# Patient Record
Sex: Male | Born: 1955 | Race: White | Hispanic: No | Marital: Married | Smoking: Former smoker
Health system: Southern US, Academic
[De-identification: ages and names within clinical notes are randomized; demographics above are authoritative.]

## PROBLEM LIST (undated history)

## (undated) DIAGNOSIS — I10 Essential (primary) hypertension: Secondary | ICD-10-CM

## (undated) DIAGNOSIS — Z973 Presence of spectacles and contact lenses: Secondary | ICD-10-CM

## (undated) HISTORY — PX: HX STENTING (ANY): 2100001347

---

## 1988-06-30 ENCOUNTER — Ambulatory Visit (HOSPITAL_COMMUNITY): Payer: Self-pay

## 2019-05-21 ENCOUNTER — Inpatient Hospital Stay (HOSPITAL_COMMUNITY)
Admission: EM | Admit: 2019-05-21 | Discharge: 2019-05-21 | Disposition: A | Discharge status: Home / Self Care | Payer: 59 | Source: Other Acute Inpatient Hospital

## 2019-05-21 DIAGNOSIS — M79662 Pain in left lower leg: Secondary | ICD-10-CM

## 2019-07-21 ENCOUNTER — Inpatient Hospital Stay (HOSPITAL_COMMUNITY)
Admission: EM | Admit: 2019-07-21 | Discharge: 2019-07-21 | Disposition: A | Discharge status: Home / Self Care | Payer: 59 | Source: Other Acute Inpatient Hospital

## 2019-07-21 DIAGNOSIS — Z20828 Contact with and (suspected) exposure to other viral communicable diseases: Secondary | ICD-10-CM

## 2019-07-21 DIAGNOSIS — R410 Disorientation, unspecified: Secondary | ICD-10-CM

## 2019-07-21 DIAGNOSIS — R220 Localized swelling, mass and lump, head: Secondary | ICD-10-CM

## 2019-07-21 DIAGNOSIS — R509 Fever, unspecified: Secondary | ICD-10-CM

## 2019-08-14 ENCOUNTER — Inpatient Hospital Stay (HOSPITAL_COMMUNITY)
Admission: EM | Admit: 2019-08-14 | Discharge: 2019-08-14 | Disposition: A | Discharge status: Home / Self Care | Payer: 59 | Source: Other Acute Inpatient Hospital

## 2019-08-14 DIAGNOSIS — R509 Fever, unspecified: Secondary | ICD-10-CM

## 2019-08-14 DIAGNOSIS — R519 Headache, unspecified: Secondary | ICD-10-CM

## 2019-08-28 ENCOUNTER — Inpatient Hospital Stay (HOSPITAL_COMMUNITY)
Admission: EM | Admit: 2019-08-28 | Discharge: 2019-08-28 | Disposition: A | Discharge status: Home / Self Care | Payer: 59 | Source: Other Acute Inpatient Hospital

## 2019-08-28 DIAGNOSIS — G060 Intracranial abscess and granuloma: Secondary | ICD-10-CM

## 2019-08-28 DIAGNOSIS — J189 Pneumonia, unspecified organism: Secondary | ICD-10-CM

## 2019-08-28 DIAGNOSIS — Z87891 Personal history of nicotine dependence: Secondary | ICD-10-CM

## 2019-08-28 DIAGNOSIS — R0902 Hypoxemia: Secondary | ICD-10-CM

## 2019-08-28 DIAGNOSIS — Z20828 Contact with and (suspected) exposure to other viral communicable diseases: Secondary | ICD-10-CM

## 2019-08-29 DIAGNOSIS — J9601 Acute respiratory failure with hypoxia: Secondary | ICD-10-CM

## 2019-08-29 DIAGNOSIS — J449 Chronic obstructive pulmonary disease, unspecified: Secondary | ICD-10-CM

## 2019-08-29 DIAGNOSIS — I10 Essential (primary) hypertension: Secondary | ICD-10-CM

## 2019-08-30 DIAGNOSIS — R0602 Shortness of breath: Secondary | ICD-10-CM

## 2020-02-06 IMAGING — MR MRI BRAIN WITHOUT AND WITH CONTRAST
11 of 12 series · 40 of 48 positions shown · IV contrast (prohance)
Comparison: none

Pertinent Hx:  Followup intracranial abscess and granuloma.
TECHNIQUE: T1, T2, and FLAIR weighted axial images of the brain were performed along with T2 weighted coronal images. Additional diffusion weighted images were performed.  15 mL Prohance is administered and T1 weighted axials, coronals, and sagittals are performed.

[Series 3: T1 · sagittal · 5.0mm · 0.47mm/px · 2 of 24 slices shown (1 of 6)]
[im 1/24]
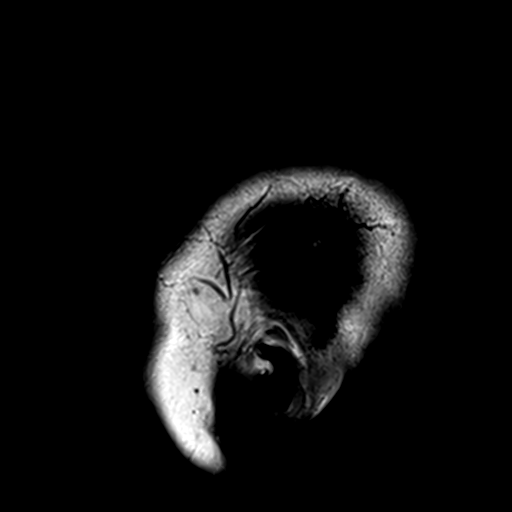
[im 24/24]
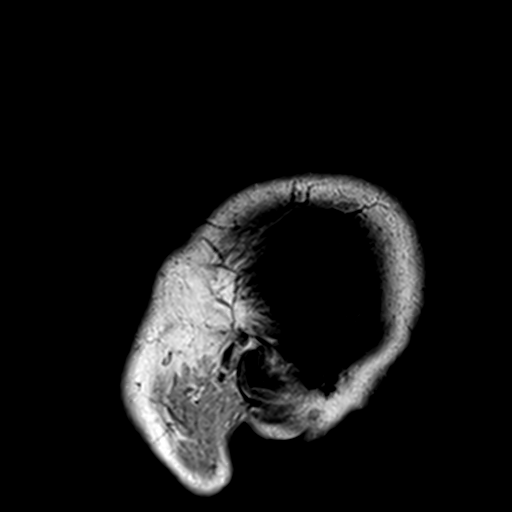

[Series 4: T2 · coronal · 5.0mm · 0.57mm/px · 3 of 28 slices shown (1 of 2)]
[im 1/28]
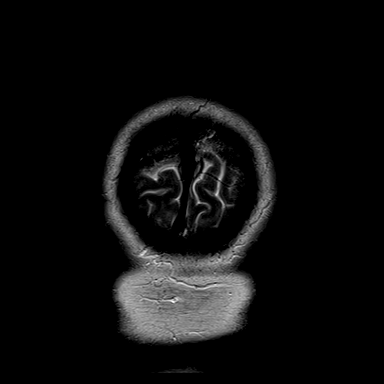
[im 14/28]
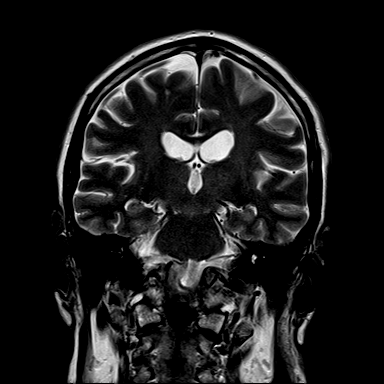
[im 28/28]
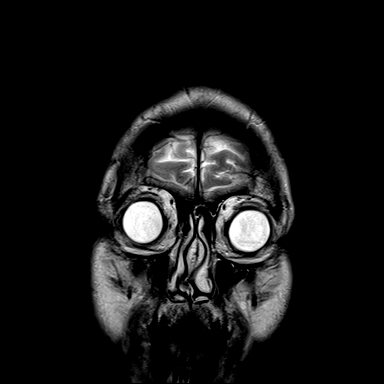

[Series 5: T2 · axial · 5.0mm · 0.57mm/px · z∈[-17,+116]mm · 3 of 26 slices shown (2 of 2)]
[im 1/26]
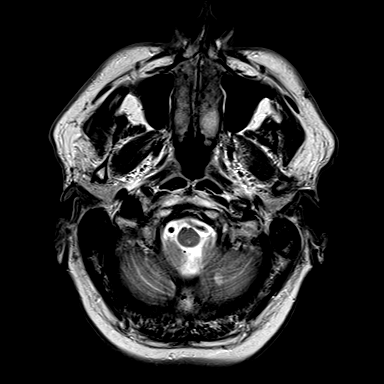
[im 13/26]
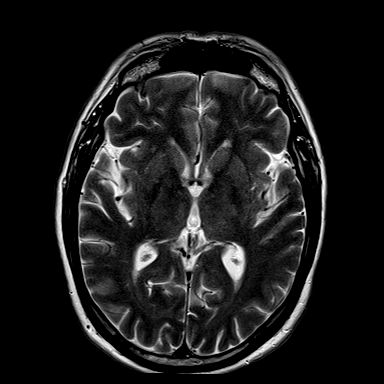
[im 26/26]
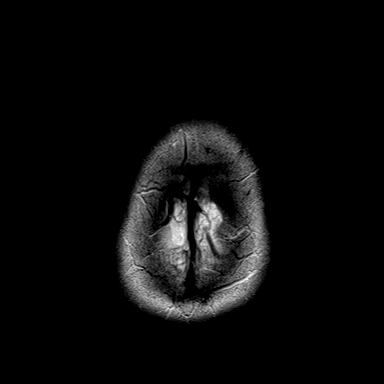

[Series 6: T1 · axial · 5.0mm · 0.69mm/px · z∈[-17,+116]mm · 3 of 26 slices shown (2 of 6)]
[im 1/26]
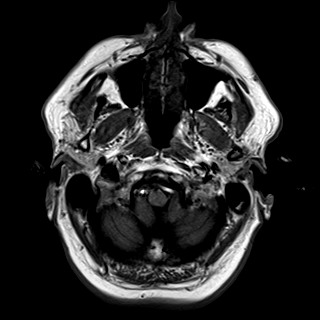
[im 13/26]
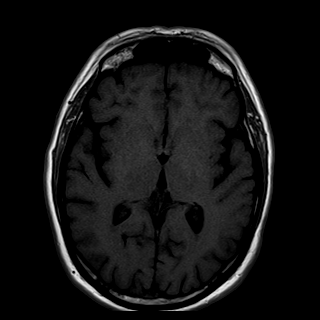
[im 26/26]
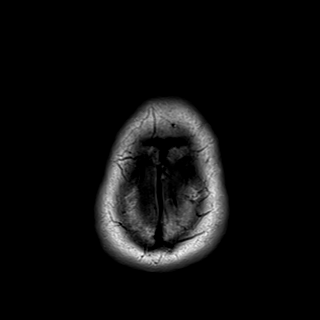

[Series 7: FLAIR · axial · 5.0mm · 0.43mm/px · z∈[-17,+116]mm · 3 of 26 slices shown]
[im 1/26]
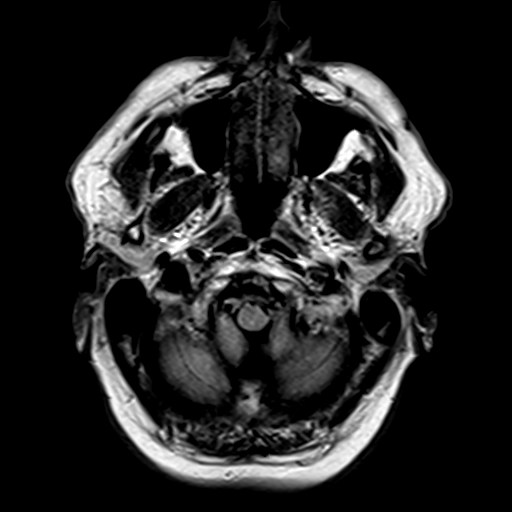
[im 13/26]
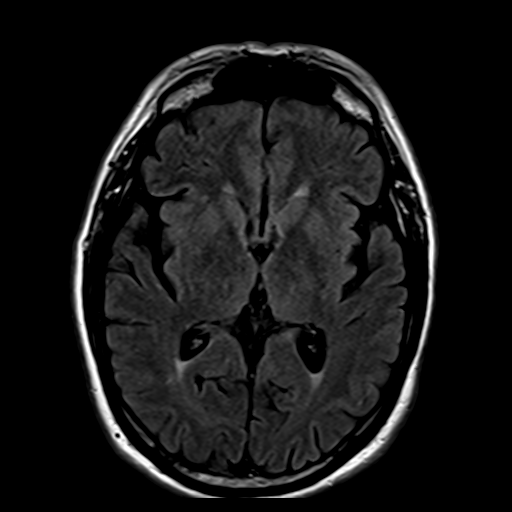
[im 26/26]
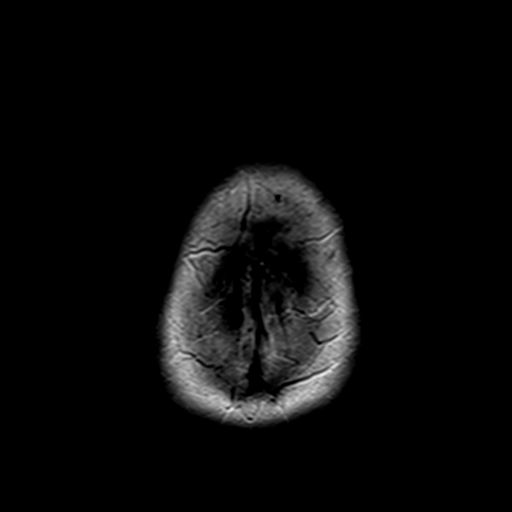

[Series 8: GRE · axial · 5.0mm · 0.69mm/px · z∈[-17,+116]mm · 3 of 26 slices shown]
[im 1/26]
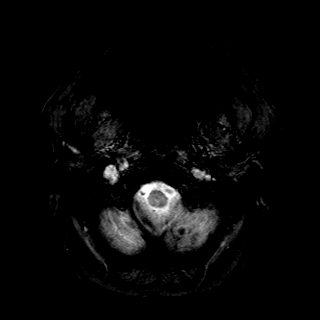
[im 13/26]
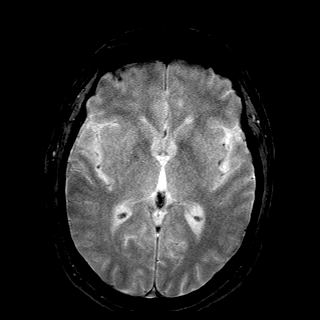
[im 26/26]
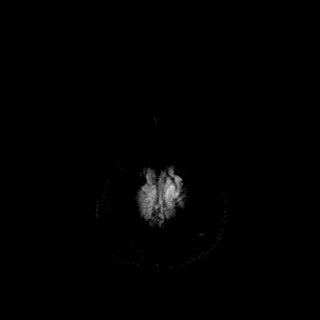

[Series 9: dfsn/t/se/epi/fsat · axial · 5.0mm · 1.72mm/px · z∈[-17,+79]mm · 6 of 78 slices shown]
[im 1/78]
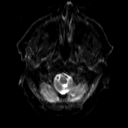
[im 12/78]
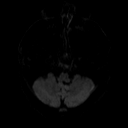
[im 23/78]
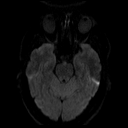
[im 34/78]
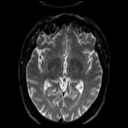
[im 45/78]
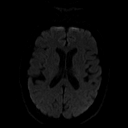
[im 56/78]
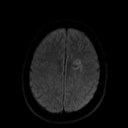

[Series 11: T1 · axial · 1.5mm · 0.69mm/px · z∈[-28,+122]mm · 8 of 104 slices shown (3 of 6)]
[im 1/104]
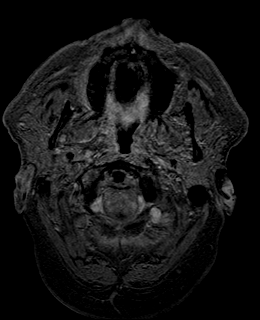
[im 21/104]
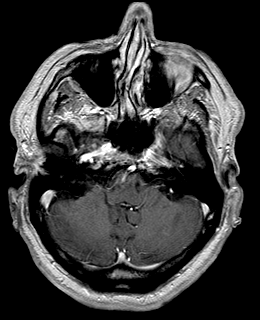
[im 31/104]
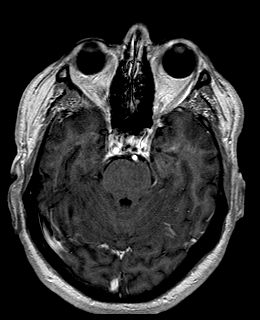
[im 42/104]
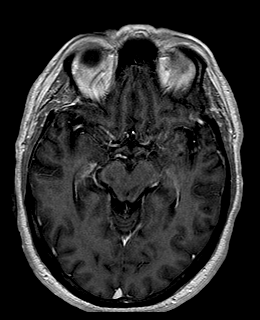
[im 62/104]
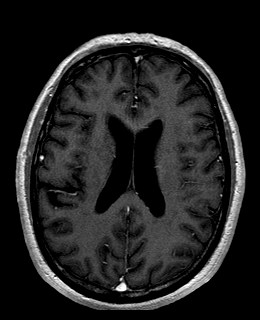
[im 73/104]
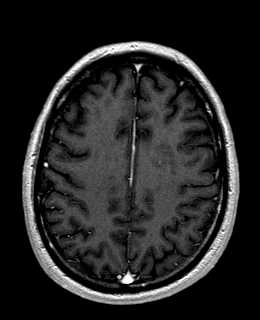
[im 83/104]
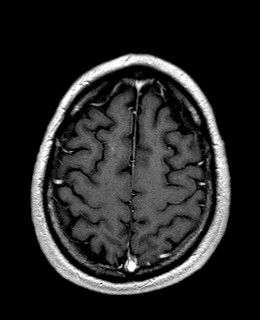
[im 104/104]
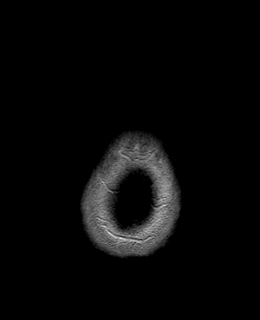

[Series 12: T1 · sagittal · 5.0mm · 0.47mm/px · 3 of 24 slices shown (4 of 6)]
[im 1/24]
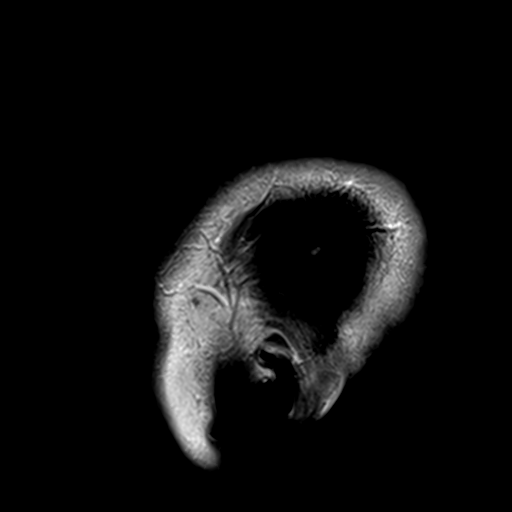
[im 12/24]
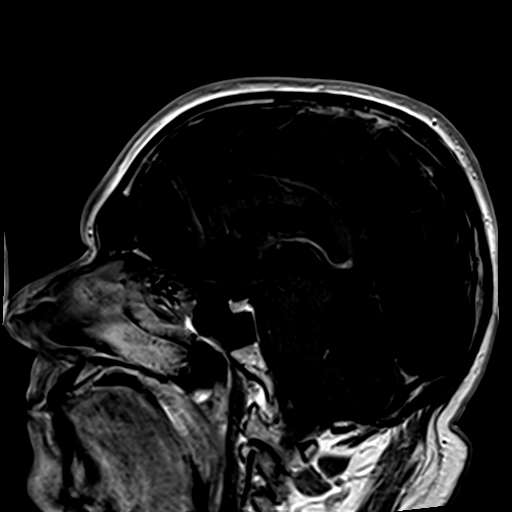
[im 24/24]
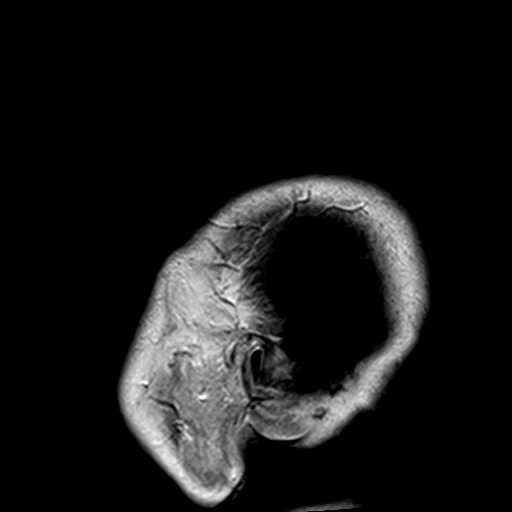

[Series 13: T1 · coronal · 5.0mm · 0.69mm/px · 3 of 28 slices shown (5 of 6)]
[im 1/28]
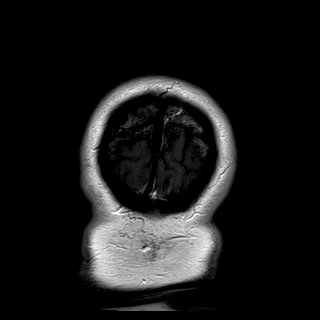
[im 14/28]
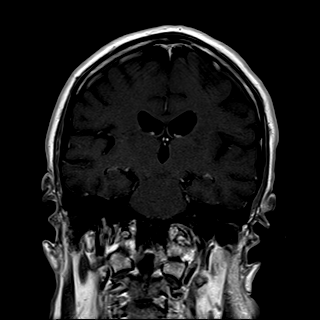
[im 28/28]
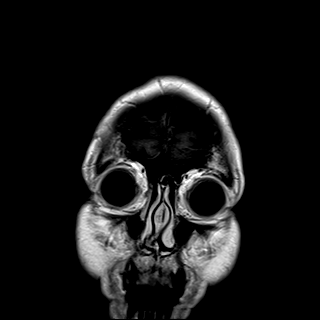

[Series 14: T1 · axial · 5.0mm · 0.86mm/px · z∈[-17,+116]mm · 3 of 26 slices shown (6 of 6)]
[im 1/26]
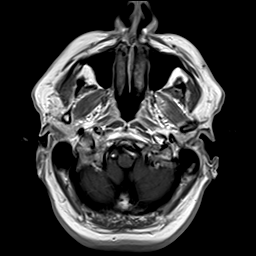
[im 13/26]
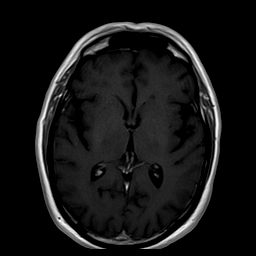
[im 26/26]
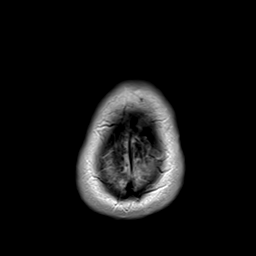

[40 of 48 positions shown; findings below may reference images not displayed]

FINDINGS: Nonspecific periventricular white matter lesions in the frontal and parietal lobes are unchanged in size.  Pinpoint enhancement is identified in the right frontal periventricular lesion and in the deep left parietal periventricular lesion which is unchanged when compared to the prior lesion November 2019.  Diffusion-weighted imaging remains positive in the right frontal periventricular lesion and the deep left parietal periventricular lesion.  No new lesions are seen.  In the noncontrasted lesions, their size remains unchanged when compared to the November 2019 scans.
IMPRESSION: When compared to the November 2019 scan, the size of the periventricular lesions and the degree of enhancement although minimal remain the same.

## 2020-08-20 IMAGING — MR MRI BRAIN WITHOUT AND WITH CONTRAST
11 series · 48 of 48 positions shown · IV contrast (prohance)
Comparison: Prior scans dated 02/06/2020 and 10/03/2019 are compared.

﻿

Pertinent Hx:  Followup intracranial mass, brain abscess.
TECHNIQUE: T1, T2, and FLAIR weighted axial images of the brain were performed along with T2 weighted coronal images. Additional diffusion weighted images were performed.  20 mL Prohance is administered and T1 weighted axials, coronals, and sagittals are performed.

[Series 2: T1 · sagittal · 5.0mm · 0.47mm/px · 2 of 24 slices shown (1 of 5)]
[im 1/24]
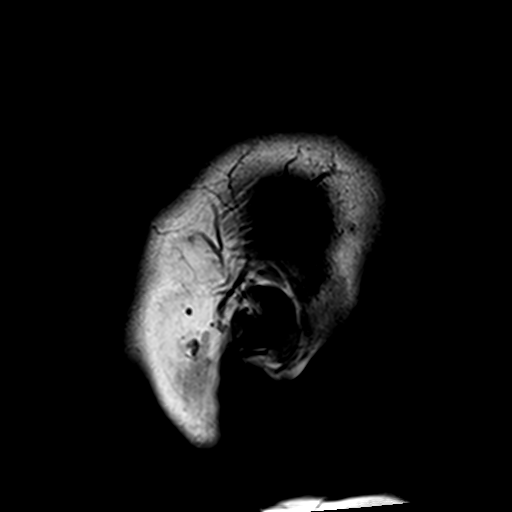
[im 24/24]
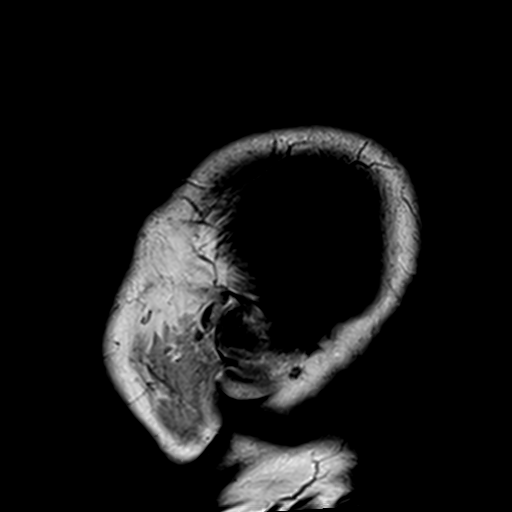

[Series 3: T2 · coronal · 5.0mm · 0.69mm/px · 4 of 28 slices shown (1 of 2)]
[im 1/28]
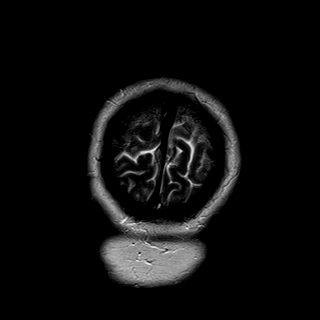
[im 10/28]
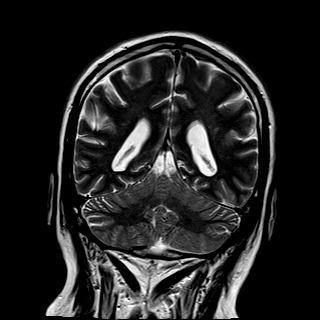
[im 19/28]
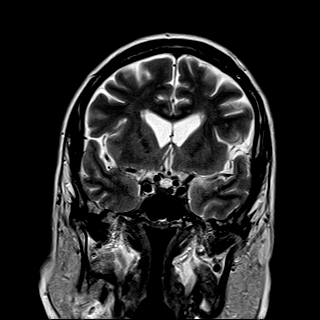
[im 28/28]
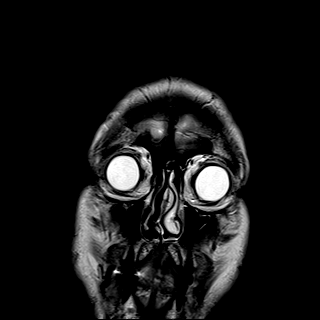

[Series 4: T2 · axial · 5.0mm · 0.69mm/px · z∈[-13,+133]mm · 4 of 28 slices shown (2 of 2)]
[im 1/28]
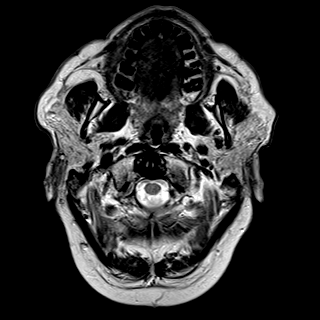
[im 10/28]
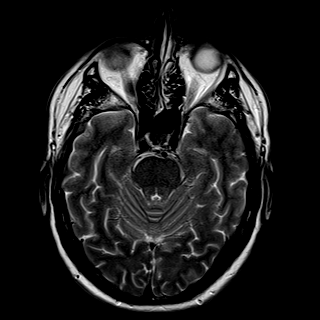
[im 19/28]
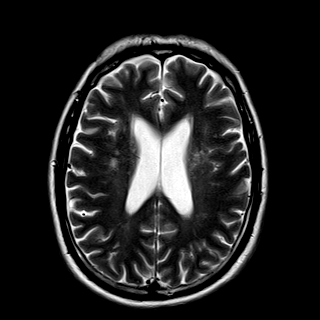
[im 28/28]
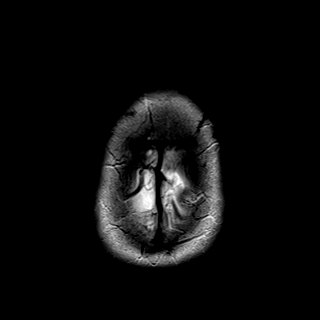

[Series 5: T1 · axial · 5.0mm · 0.86mm/px · z∈[-13,+133]mm · 4 of 28 slices shown (2 of 5)]
[im 1/28]
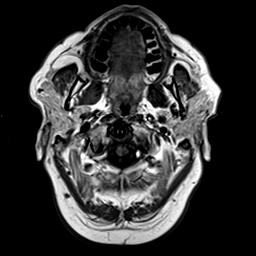
[im 10/28]
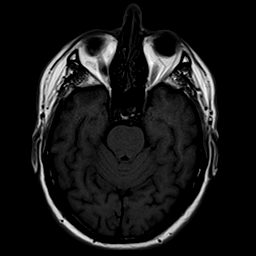
[im 19/28]
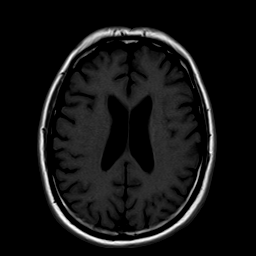
[im 28/28]
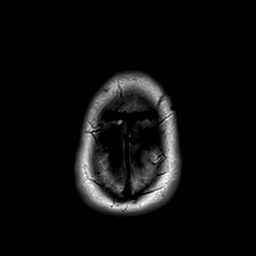

[Series 6: FLAIR · axial · 5.0mm · 0.69mm/px · z∈[-13,+133]mm · 4 of 28 slices shown]
[im 1/28]
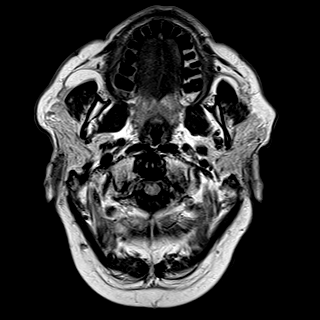
[im 10/28]
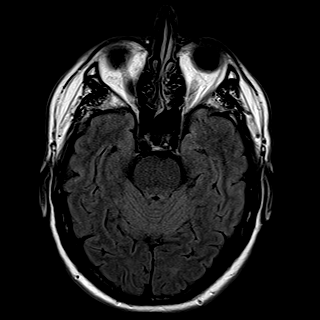
[im 19/28]
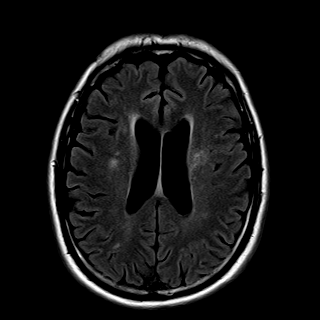
[im 28/28]
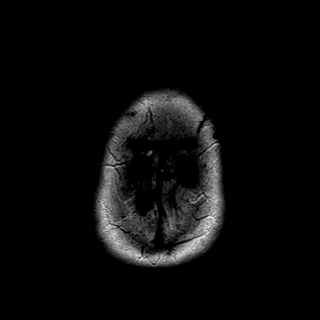

[Series 7: GRE · axial · 5.0mm · 0.86mm/px · z∈[-13,+133]mm · 4 of 28 slices shown]
[im 1/28]
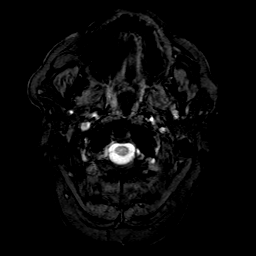
[im 10/28]
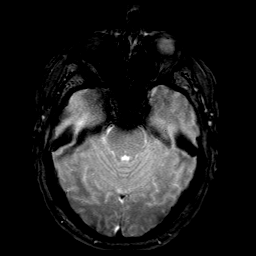
[im 19/28]
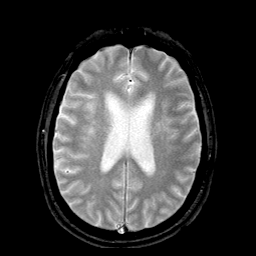
[im 28/28]
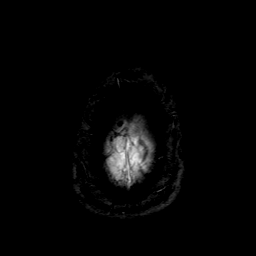

[Series 8: dfsn/t/se/epi/fsat · axial · 5.0mm · 1.88mm/px · z∈[-11,+135]mm · 11 of 84 slices shown]
[im 1/84]
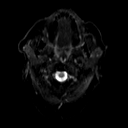
[im 9/84]
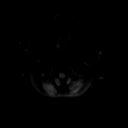
[im 17/84]
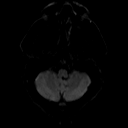
[im 25/84]
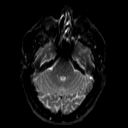
[im 34/84]
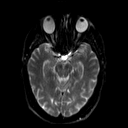
[im 42/84]
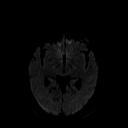
[im 50/84]
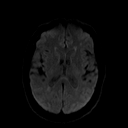
[im 59/84]
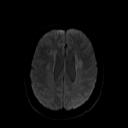
[im 67/84]
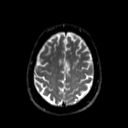
[im 75/84]
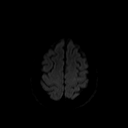
[im 84/84]
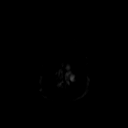

[Series 9: dfsn/t/se/epi/fsat_adc · axial · 5.0mm · 1.88mm/px · z∈[-11,+135]mm · 4 of 28 slices shown]
[im 1/28]
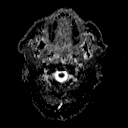
[im 10/28]
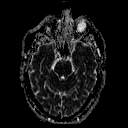
[im 19/28]
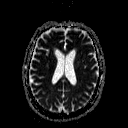
[im 28/28]
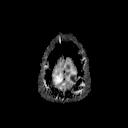

[Series 10: T1 · sagittal · 5.0mm · 0.47mm/px · 3 of 24 slices shown (3 of 5)]
[im 1/24]
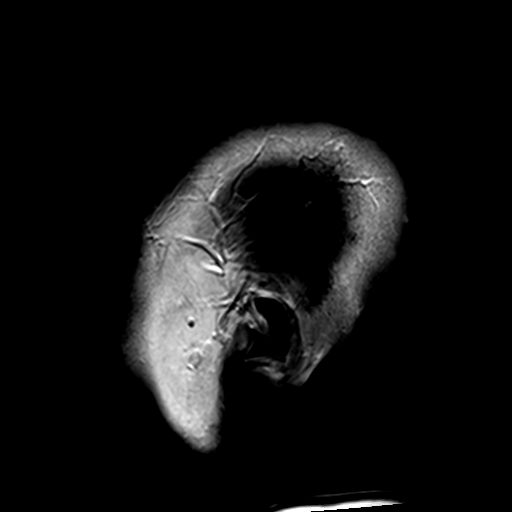
[im 12/24]
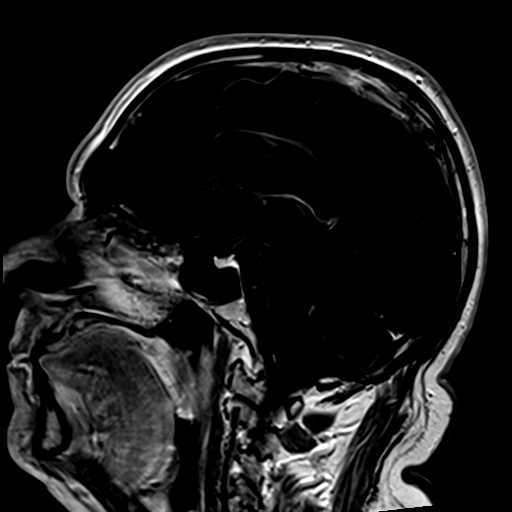
[im 24/24]
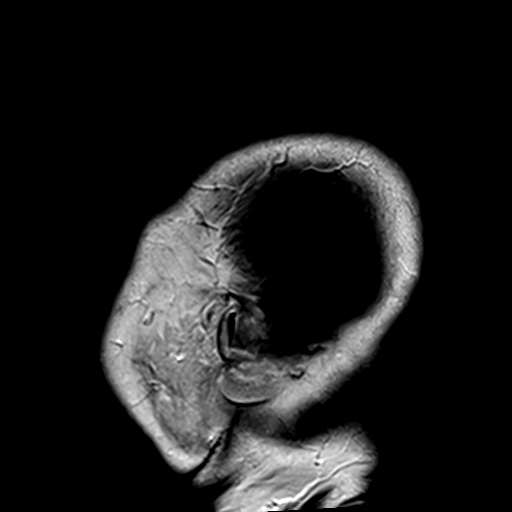

[Series 11: T1 · axial · 5.0mm · 0.86mm/px · z∈[-13,+133]mm · 4 of 28 slices shown (4 of 5)]
[im 1/28]
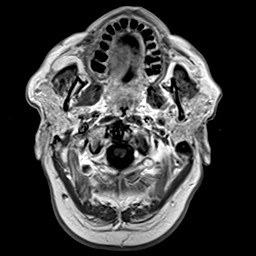
[im 10/28]
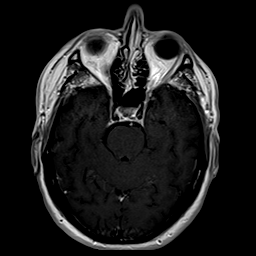
[im 19/28]
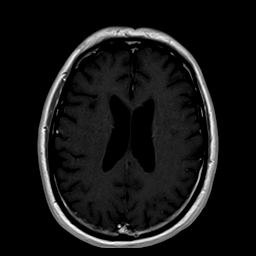
[im 28/28]
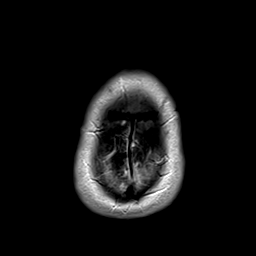

[Series 12: T1 · coronal · 5.0mm · 0.69mm/px · 4 of 28 slices shown (5 of 5)]
[im 1/28]
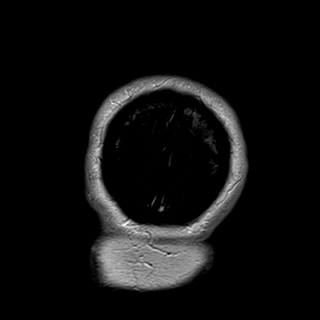
[im 10/28]
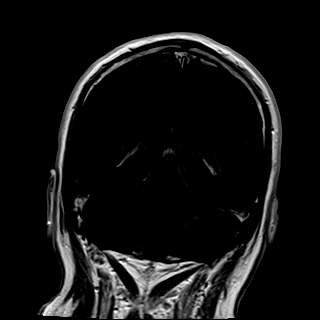
[im 19/28]
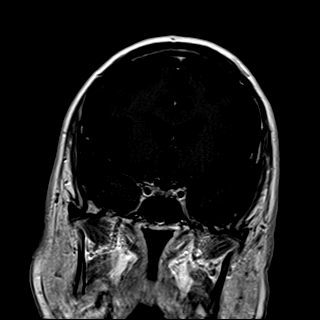
[im 28/28]
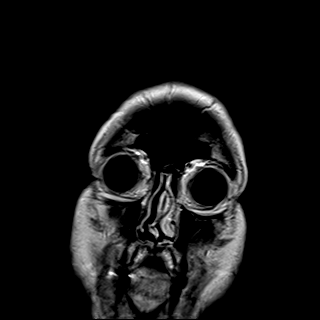

[48 of 48 positions shown; findings below may reference images not displayed]

FINDINGS: There continue to be several periventricular white matter lesions that have not changed in size since the prior scan of 02/06/2020.  There has been a minimal reduction in what was a very small amount of enhancement of the lesion adjacent to the right anterior lateral ventricle.  In addition, there is no further enhancement of a lesion that was against the lateral wall of the body of the left lateral ventricle, image 20 series 11.  No new lesions are identified.  No other areas of significant enhancement are identified with the exception of a small enhancing venous angioma in the right posterior frontal centrum semiovale.  Image 24 series 11.  There is almost no enhancement in the periventricular lesions that enhanced previously.
IMPRESSION: Definite and continued improvement based on the degree of enhancement of the two periventricular abscesses.  They have not changed in size without contrast but there is now even less enhancement in both of them.

## 2020-09-24 ENCOUNTER — Other Ambulatory Visit (HOSPITAL_COMMUNITY): Payer: Self-pay | Admitting: FAMILY PRACTICE

## 2020-09-24 DIAGNOSIS — I208 Other forms of angina pectoris: Secondary | ICD-10-CM

## 2020-10-15 ENCOUNTER — Ambulatory Visit
Admission: RE | Admit: 2020-10-15 | Discharge: 2020-10-15 | Disposition: A | Discharge status: Home / Self Care | Payer: 59 | Source: Ambulatory Visit | Attending: FAMILY PRACTICE | Admitting: FAMILY PRACTICE

## 2020-10-15 ENCOUNTER — Ambulatory Visit (HOSPITAL_COMMUNITY)
Admission: RE | Admit: 2020-10-15 | Discharge: 2020-10-15 | Disposition: A | Discharge status: Home / Self Care | Payer: 59 | Source: Ambulatory Visit | Attending: FAMILY PRACTICE | Admitting: FAMILY PRACTICE

## 2020-10-15 ENCOUNTER — Other Ambulatory Visit: Payer: Self-pay

## 2020-10-15 ENCOUNTER — Ambulatory Visit (HOSPITAL_COMMUNITY): Payer: 59 | Admitting: NUCLEAR MEDICINE

## 2020-10-15 DIAGNOSIS — I259 Chronic ischemic heart disease, unspecified: Secondary | ICD-10-CM | POA: Insufficient documentation

## 2020-10-15 DIAGNOSIS — I208 Other forms of angina pectoris: Secondary | ICD-10-CM | POA: Insufficient documentation

## 2020-10-15 MED ORDER — REGADENOSON 0.4 MG/5 ML INTRAVENOUS SYRINGE
0.4000 mg | INJECTION | Freq: Once | INTRAVENOUS | Status: AC
Start: 2020-10-15 — End: 2020-10-15
  Administered 2020-10-15: 0.4 mg via INTRAVENOUS

## 2020-10-15 MED ORDER — REGADENOSON 0.4 MG/5 ML INTRAVENOUS SYRINGE
INJECTION | INTRAVENOUS | Status: AC
Start: 2020-10-15 — End: 2020-10-15
  Filled 2020-10-15: qty 5

## 2020-10-15 NOTE — Nurses Notes (Signed)
Presented to stress lab in stable condition. Teaching for test done and understanding voiced. Tolerated well. Drank water easily post test. Gait steady. Condition stable on departure with belongings to waiting area. For nuclear scan.

## 2020-10-16 LAB — MYOCARDIAL PERFUSION COMPLETE: ST DEPRESSION - STRESS: 0 mm

## 2020-10-31 ENCOUNTER — Other Ambulatory Visit: Payer: Self-pay

## 2020-10-31 ENCOUNTER — Ambulatory Visit: Payer: 59

## 2020-10-31 DIAGNOSIS — D649 Anemia, unspecified: Secondary | ICD-10-CM | POA: Insufficient documentation

## 2020-10-31 DIAGNOSIS — I998 Other disorder of circulatory system: Secondary | ICD-10-CM

## 2020-10-31 LAB — BASIC METABOLIC PANEL
ANION GAP: 8 mmol/L (ref 6–15)
BUN: 13 mg/dL (ref 7–21)
CALCIUM: 9.6 mg/dL (ref 8.0–10.6)
CHLORIDE: 101 mmol/L (ref 98–107)
CO2 TOTAL: 31 mmol/L (ref 21–32)
CREATININE: 0.86 mg/dL (ref 0.80–1.60)
ESTIMATED GFR: >60 mL/min/{1.73_m2}
GLUCOSE: 95 mg/dL (ref 70–100)
POTASSIUM: 4 mmol/L (ref 3.3–5.1)
SODIUM: 140 mmol/L (ref 136–146)

## 2020-10-31 LAB — CBC WITH DIFF
BASOPHIL #: <0.1 10*3/uL (ref <=0.20)
BASOPHIL %: 1 %
EOSINOPHIL #: <0.1 10*3/uL (ref <=0.50)
EOSINOPHIL %: 1 %
HCT: 37.5 % — ABNORMAL LOW (ref 38.9–52.0)
HGB: 11.8 g/dL — ABNORMAL LOW (ref 13.4–17.5)
IMMATURE GRANULOCYTE #: <0.1 10*3/uL (ref <0.10)
IMMATURE GRANULOCYTE %: 0 % (ref 0–1)
LYMPHOCYTE #: 1.75 10*3/uL (ref 1.00–4.80)
LYMPHOCYTE %: 21 %
MCH: 28.8 pg (ref 26.0–32.0)
MCHC: 31.5 g/dL (ref 31.0–35.5)
MCV: 91.5 fL (ref 78.0–100.0)
MONOCYTE #: 0.84 10*3/uL (ref 0.20–1.10)
MONOCYTE %: 10 %
MPV: 10.6 fL (ref 8.7–12.5)
NEUTROPHIL #: 5.54 10*3/uL (ref 1.50–7.70)
NEUTROPHIL %: 67 %
PLATELETS: 293 10*3/uL (ref 150–400)
RBC: 4.1 10*6/uL — ABNORMAL LOW (ref 4.50–6.10)
RDW-CV: 13.1 % (ref 11.5–15.5)
WBC: 8.3 10*3/uL (ref 3.7–11.0)

## 2020-10-31 LAB — PT/INR
INR: 1.08 (ref 0.90–1.10)
PROTHROMBIN TIME: 11 s (ref 9.0–13.0)

## 2021-02-28 ENCOUNTER — Ambulatory Visit
Admission: RE | Admit: 2021-02-28 | Discharge: 2021-02-28 | Disposition: A | Discharge status: Home / Self Care | Payer: 59 | Source: Ambulatory Visit | Attending: FAMILY PRACTICE | Admitting: FAMILY PRACTICE

## 2021-02-28 ENCOUNTER — Other Ambulatory Visit (HOSPITAL_COMMUNITY): Payer: Self-pay | Admitting: FAMILY PRACTICE

## 2021-02-28 ENCOUNTER — Other Ambulatory Visit: Payer: Self-pay

## 2021-02-28 DIAGNOSIS — R52 Pain, unspecified: Secondary | ICD-10-CM

## 2021-05-09 ENCOUNTER — Encounter (HOSPITAL_COMMUNITY): Payer: Self-pay | Admitting: Gastroenterology

## 2021-05-14 ENCOUNTER — Ambulatory Visit (HOSPITAL_BASED_OUTPATIENT_CLINIC_OR_DEPARTMENT_OTHER): Payer: 59 | Admitting: Certified Registered"

## 2021-05-14 ENCOUNTER — Inpatient Hospital Stay
Admission: RE | Admit: 2021-05-14 | Discharge: 2021-05-14 | Disposition: A | Discharge status: Home / Self Care | Payer: 59 | Source: Ambulatory Visit | Attending: Gastroenterology | Admitting: Gastroenterology

## 2021-05-14 ENCOUNTER — Other Ambulatory Visit: Payer: Self-pay

## 2021-05-14 ENCOUNTER — Ambulatory Visit (HOSPITAL_COMMUNITY): Payer: 59 | Admitting: Certified Registered"

## 2021-05-14 ENCOUNTER — Encounter (HOSPITAL_COMMUNITY)
Admission: RE | Disposition: A | Discharge status: Home / Self Care | Payer: Self-pay | Source: Ambulatory Visit | Attending: Gastroenterology

## 2021-05-14 ENCOUNTER — Encounter (HOSPITAL_COMMUNITY): Payer: Self-pay | Admitting: Gastroenterology

## 2021-05-14 DIAGNOSIS — Z86010 Personal history of colonic polyps: Secondary | ICD-10-CM

## 2021-05-14 DIAGNOSIS — Z1211 Encounter for screening for malignant neoplasm of colon: Secondary | ICD-10-CM

## 2021-05-14 HISTORY — DX: Presence of spectacles and contact lenses: Z97.3

## 2021-05-14 HISTORY — DX: Essential (primary) hypertension: I10

## 2021-05-14 SURGERY — COLONOSCOPY
Anesthesia: Monitor Anesthesia Care | Wound class: Clean Contaminated Wounds-The respiratory, GI, Genital, or urinary

## 2021-05-14 MED ORDER — FENTANYL (PF) 50 MCG/ML INJECTION SOLUTION
Freq: Once | INTRAMUSCULAR | Status: DC | PRN
Start: 2021-05-14 — End: 2021-05-14
  Administered 2021-05-14: 50 ug via INTRAVENOUS
  Administered 2021-05-14 (×2): 25 ug via INTRAVENOUS

## 2021-05-14 MED ORDER — LIDOCAINE (PF) 100 MG/5 ML (2 %) INTRAVENOUS SYRINGE
INJECTION | Freq: Once | INTRAVENOUS | Status: DC | PRN
Start: 2021-05-14 — End: 2021-05-14
  Administered 2021-05-14: 50 mg via INTRAVENOUS

## 2021-05-14 MED ORDER — LACTATED RINGERS INTRAVENOUS SOLUTION
INTRAVENOUS | Status: DC
Start: 2021-05-14 — End: 2021-05-14
  Administered 2021-05-14 (×2): 0 via INTRAVENOUS

## 2021-05-14 MED ORDER — PROPOFOL 10 MG/ML IV BOLUS
INJECTION | Freq: Once | INTRAVENOUS | Status: DC | PRN
Start: 2021-05-14 — End: 2021-05-14
  Administered 2021-05-14: 30 mg via INTRAVENOUS
  Administered 2021-05-14: 50 mg via INTRAVENOUS
  Administered 2021-05-14: 100 mg via INTRAVENOUS
  Administered 2021-05-14 (×2): 50 mg via INTRAVENOUS

## 2021-05-14 MED ORDER — SIMETHICONE 40 MG/0.6 ML ORAL DROPS,SUSPENSION
Freq: Once | ORAL | Status: DC | PRN
Start: 2021-05-14 — End: 2021-05-14
  Administered 2021-05-14 (×2): 40 mg via ORAL

## 2021-05-14 MED ORDER — GLYCOPYRROLATE 0.2 MG/ML INJECTION SOLUTION
Freq: Once | INTRAMUSCULAR | Status: DC | PRN
Start: 2021-05-14 — End: 2021-05-14
  Administered 2021-05-14: .1 mg via INTRAVENOUS

## 2021-05-14 SURGICAL SUPPLY — 6 items
CANISTER 2000 SUCT BLUE TOP FLUSHABLE (SUPP) ×1 IMPLANT
GLOVE EXAM LG NITRIL PF LAVEN GLOVE EXAM LG NITRIL PF LAVEN (GLOVES AND ACCESSORIES) ×1 IMPLANT
KIT 00719701 COMPLIANCE KIT 00719701 COMPLIANCE (SUPP) ×1 IMPLANT
TRAP POLYP QUAD CHAMBER TRAP POLYP QUAD CHAMBER (SURG) IMPLANT
TUBING SUCTION CONNECT10FT TUBING SUCTION CONNECT 10ft (SUPP) ×2 IMPLANT
UNDERPADS 23X36 DRI-SORB YPROP MDRT ABS NWVN LF DISP (MED) ×1 IMPLANT

## 2021-05-14 NOTE — Discharge Instructions (Addendum)
SURGICAL DISCHARGE INSTRUCTIONS     Dr. Theresa Mulligan, MD  performed your COLONOSCOPY today at the [facility]    [facility] ([place])  Monday through Friday 8:00 am to 4:00 pm  [phone]      PLEASE SEE WRITTEN HANDOUTS AS DISCUSSED BY YOUR NURSE:      SIGNS AND SYMPTOMS OF A WOUND / INCISION INFECTION   Be sure to watch for the following:   Increase in redness or red streaks near or around the wound IV site          **CALL YOUR DOCTOR IF ONE OR MORE OF THESE SIGNS / SYMPTOMS SHOULD OCCUR.    ANESTHESIA INFORMATION   ANESTHESIA -- ADULT PATIENTS:  You have received intravenous sedation / general anesthesia, and you may feel drowsy and light-headed for several hours. You may even experience some forgetfulness of the procedure. DO NOT DRIVE A MOTOR VEHICLE or perform any activity requiring complete alertness or coordination until you feel fully awake in about 24-48 hours. Do not drink alcoholic beverages for at least 24 hours. Do not stay alone, you must have a responsible adult available to be with you. You may also experience a dry mouth or nausea for 24 hours. This is a normal side effect and will disappear as the effects of the medication wear off.    REMEMBER   If you experience any difficulty breathing, chest pain, bleeding that you feel is excessive, persistent nausea or vomiting or for any other concerns:  Call your physician Dr.  Theresa Mulligan, MD or [phone] You may also ask to have the general doctor on call paged. They are available to you 24 hours a day.    SPECIAL INSTRUCTIONS / COMMENTS       FOLLOW-UP APPOINTMENTS   Please call your surgeon's office at the number listed about  to schedule a date / time of return.

## 2021-05-14 NOTE — Anesthesia Transfer of Care (Signed)
ANESTHESIA TRANSFER OF CARE   Jeremiah Harris is a 65 y.o. ,male, Weight: 87.1 kg (192 lb)   had Procedure(s):  COLONOSCOPY  performed  05/14/21   Primary Service: Theresa Mulligan,*    Past Medical History:   Diagnosis Date   . Essential hypertension    . Wears glasses       Allergy History as of 05/14/21      No Known Allergies              I completed my transfer of care / handoff to the receiving personnel during which we discussed:  Access, Airway, All key/critical aspects of case discussed, Analgesia, Antibiotics, Expectation of post procedure, Fluids/Product, Gave opportunity for questions and acknowledgement of understanding, Labs and PMHx    Post Location: Phase II                                       Report given to: Rhae Hammock, RN                           Last OR Temp: Temperature: 36 C (96.8 F)  ABG:  POTASSIUM   Date Value Ref Range Status   10/31/2020 4.0 3.3 - 5.1 mmol/L Final     CALCIUM   Date Value Ref Range Status   10/31/2020 9.6 8.0 - 10.6 mg/dL Final     Airway:* No LDAs found *  Blood pressure (!) 111/37, pulse 65, temperature 36 C (96.8 F), resp. rate 14, height 1.676 m (5\' 6" ), weight 87.1 kg (192 lb), SpO2 100 %.

## 2021-05-14 NOTE — Anesthesia Preprocedure Evaluation (Signed)
ANESTHESIA PRE-OP EVALUATION  Planned Procedure: COLONOSCOPY (N/A )  Review of Systems     anesthesia history negative               Pulmonary     Cardiovascular    Hypertension, CAD and cardiac stents ,No peripheral edema,        GI/Hepatic/Renal    Anemia/? hematochexia        Endo/Other         Neuro/Psych/MS        Cancer                      Physical Assessment      Airway       Mallampati: II    TM distance: >3 FB    Neck ROM: full  Mouth Opening: good.            Dental       Dentition intact             Pulmonary    Breath sounds clear to auscultation  (-) no rhonchi, no decreased breath sounds, no wheezes, no rales and no stridor     Cardiovascular    Rhythm: regular  Rate: Normal  (-) no friction rub, carotid bruit is not present, no peripheral edema and no murmur     Other findings            Plan  ASA 3     Planned anesthesia type: MAC                     Intravenous induction       Anesthetic plan and risks discussed with patient             Patient's NPO status is appropriate for Anesthesia.

## 2021-05-14 NOTE — H&P (Signed)
Same Day Surgery History & Physical    Jeremiah Harris, Jeremiah Harris, 65 y.o. male  Date of Birth:  1956/01/27  Date of service: 05/14/2021      Chief Complaint:  Personal history of adenomatous colon polyps    PCP: Barton Fanny, MD    HPI:    Jeremiah Harris is a 65 y.o., White male who presents with personal history of adenomatous colon polyps for surveillance follow-up        PAST MEDICAL/ FAMILY/ SOCIAL HISTORY:       Past Medical History:   Diagnosis Date   . Essential hypertension    . Wears glasses          No Known Allergies  Medications Prior to Admission     Prescriptions    atorvastatin (LIPITOR) 80 mg Oral Tablet    Take 80 mg by mouth Every evening    cholecalciferol, vitamin D3, 25 mcg (1,000 unit) Oral Tablet    Take 1,000 Units by mouth Once a day    clopidogreL (PLAVIX) 75 mg Oral Tablet    Take 75 mg by mouth Once a day    ferrous sulfate (FERATAB) 324 mg (65 mg iron) Oral Tablet, Delayed Release (E.C.)    Take 324 mg by mouth Twice daily    furosemide (LASIX) 20 mg Oral Tablet    Take 20 mg by mouth Once a day    metoprolol tartrate (LOPRESSOR) 100 mg Oral Tablet    Take 100 mg by mouth Once a day    omeprazole (PRILOSEC) 20 mg Oral Capsule, Delayed Release(E.C.)    Take 20 mg by mouth Once a day    pediatric multivitamins Oral Tablet, Chewable    Chew 1 Tablet Once a day    potassium chloride (K-DUR) 10 mEq Oral Tab Sust.Rel. Particle/Crystal    Take 10 mEq by mouth Once a day    Valsartan (DIOVAN) 320 mg Oral Tablet    Take 320 mg by mouth Once a day    verapamiL 240 mg Oral Tablet Sustained Release    Take 240 mg by mouth Once a day         LR premix infusion, , Intravenous, Continuous      Past Surgical History:   Procedure Laterality Date   . HX STENTING (ANY)           Family Medical History:    None         Social History     Tobacco Use   . Smoking status: Former Games developer   . Smokeless tobacco: Never Used   Vaping Use   . Vaping Use: Every day   Substance Use Topics   . Alcohol use: Yes     Comment:  occasional         EXAM:  GENERAL APPEARANCE: Shows well-developed and well-nourished in no acute distress. Oriented x3.   EYES: There is no scleral icterus. PERRL.   ENT: Nose - normal mucosa. Oral cavity - Dentition well preserved and in good repair. Oral mucosa without lesions. No sublingual icterus. Tongue without lesions or evidence of nutritional deficiencies.   NECK: Symmetrical. No pain. No masses. Thyroid gland is normal. No adenopathy.   RESP.: Lungs are clear to auscultation and respiratory effort is unlabored and normal.   CARDIOVASCULAR: Regular rate. There are no murmurs, rubs or gallops appreciated. The abdominal aorta is of normal size, non-pulsatile, nontender. No bruits.   ABDOMEN: Nondistended, soft, nontender, normal bowel sounds. No  hepatosplenomegaly. No masses. No bruits.   RECTAL: Not done.   LYMPH: There is no cervical or inguinal adenopathy.   MUSCULOSKELETAL: Digits without clubbing, cyanosis or edema. Normal nails. Head is normocephalic. Neck with full range of motion.   SKIN: No rashes. There is no stigmata of chronic liver disease. There are no skin lesions or nodules palpated. There is no skin tightening.   PSYCHIATRIC: Patient is alert and oriented x3. Patient does not appear anxious, depressed or agitated        IMPRESSION:    Personal history of colon polyps    Recommendations   Colonoscopy          Theresa Mulligan, MD 05/14/2021, 10:49

## 2021-05-14 NOTE — Anesthesia Postprocedure Evaluation (Signed)
Anesthesia Post Op Evaluation    Patient: Jeremiah Harris  Procedure(s):  COLONOSCOPY    Last Vitals:Temperature: 36 C (96.8 F) (05/14/21 1117)  Heart Rate: 70 (05/14/21 1147)  BP (Non-Invasive): (!) 160/54 (05/14/21 1148)  Respiratory Rate: 18 (05/14/21 1148)  SpO2: 99 % (05/14/21 1148)    No complications documented.    Patient is sufficiently recovered from the effects of anesthesia to participate in the evaluation and has returned to their pre-procedure level.  Patient location during evaluation: PACU       Patient participation: complete - patient participated  Level of consciousness: awake and alert and responsive to verbal stimuli    Pain score: 0  Pain management: adequate  Airway patency: patent    Anesthetic complications: no  Cardiovascular status: acceptable  Respiratory status: acceptable  Hydration status: acceptable  Patient post-procedure temperature: Pt Normothermic   PONV Status: Absent

## 2021-08-12 IMAGING — MR MRI BRAIN WITHOUT AND WITH CONTRAST
7 of 11 series · 28 of 48 positions shown · IV contrast (prohance)
Comparison: Prior scan dated November 06, 2021 is reviewed and compared.

------------- REPORT [id number] -------------
[HOSPITAL] Imaging Center

------------- REPORT [id number] -------------
﻿
Pertinent Hx:  Follow-up mass removal.  No new symptoms.
TECHNIQUE: T1, T2, and FLAIR weighted axial images of the brain were performed along with T2 weighted coronal images. Additional diffusion weighted images were performed.  20 mL Prohance is administered and T1 weighted axials, coronals, and sagittals are performed.

[Series 2: T1 · sagittal · 5.0mm · 0.47mm/px · 5 of 24 slices shown (1 of 3)]
[im 1/24]
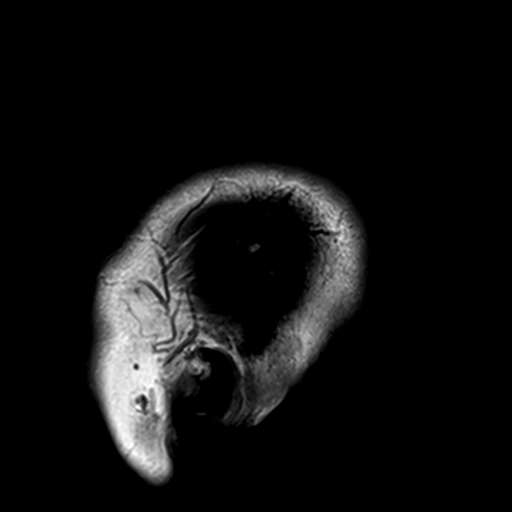
[im 6/24]
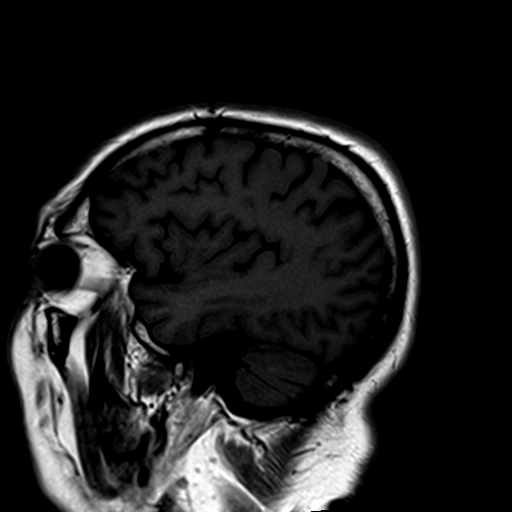
[im 12/24]
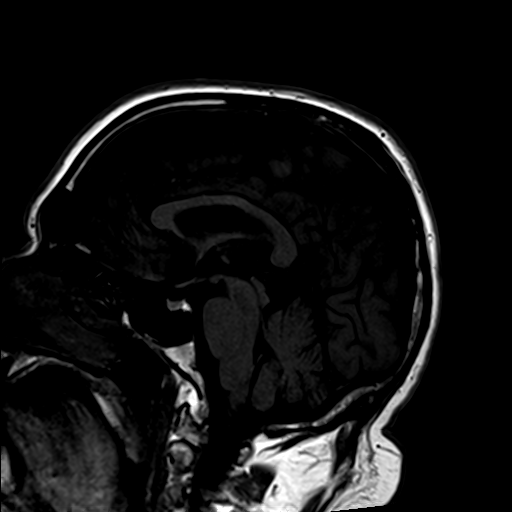
[im 18/24]
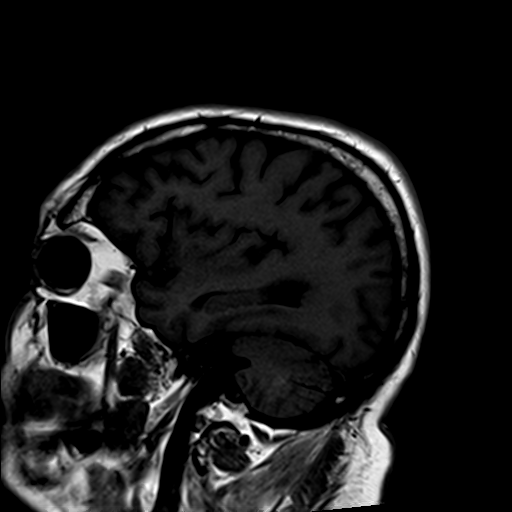
[im 24/24]
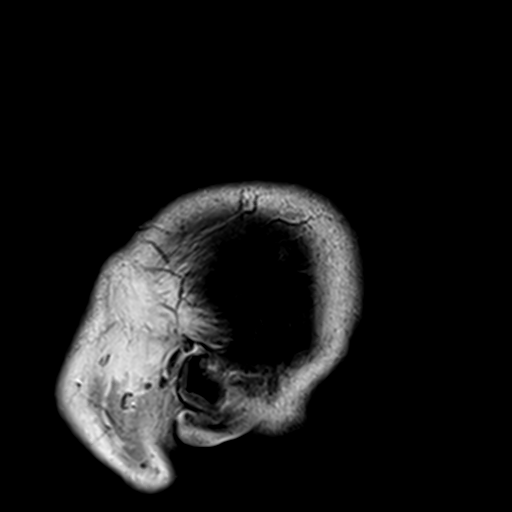

[Series 3: T2 · coronal · 5.0mm · 0.62mm/px · 6 of 28 slices shown (1 of 2)]
[im 1/28]
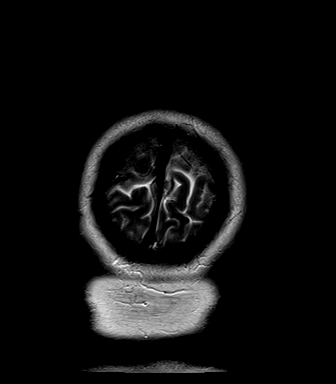
[im 6/28]
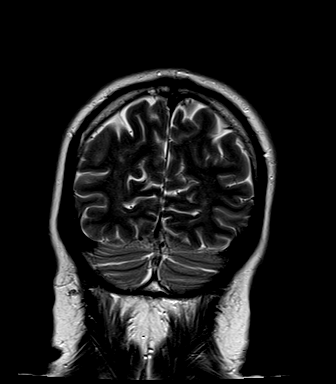
[im 11/28]
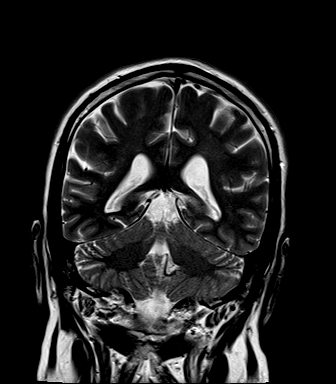
[im 17/28]
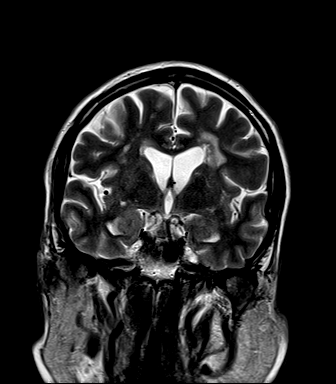
[im 22/28]
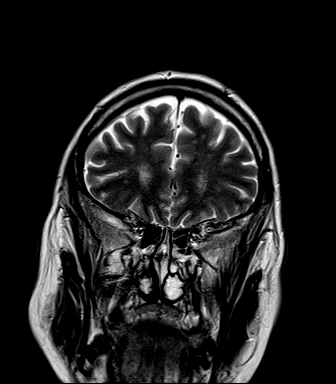
[im 28/28]
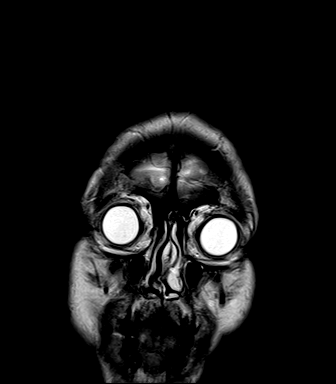

[Series 4: T2 · axial · 5.0mm · 0.62mm/px · z∈[-14,+117]mm · 4 of 26 slices shown (2 of 2)]
[im 1/26]
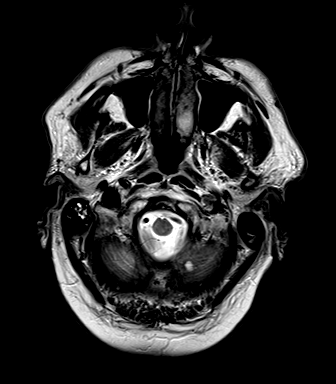
[im 9/26]
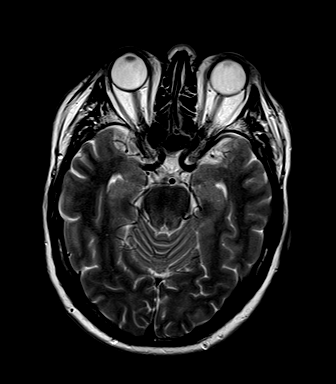
[im 17/26]
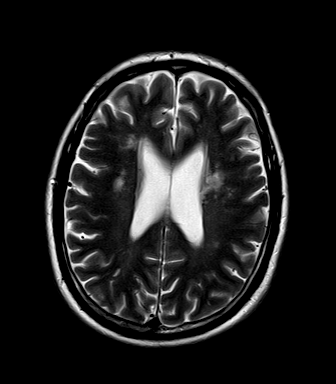
[im 26/26]
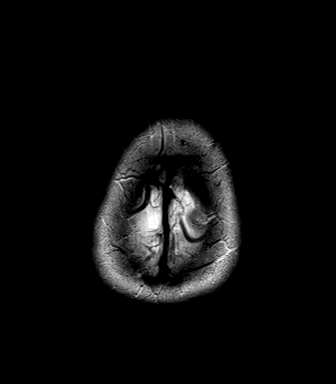

[Series 5: T1 · axial · 5.0mm · 0.62mm/px · z∈[-14,+117]mm · 4 of 26 slices shown (2 of 3)]
[im 1/26]
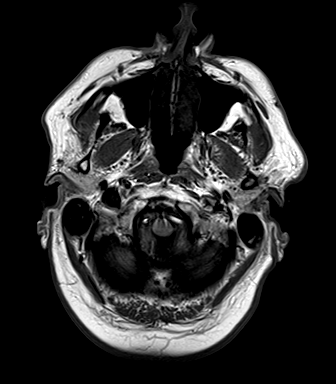
[im 9/26]
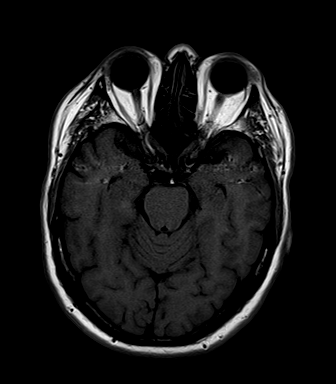
[im 17/26]
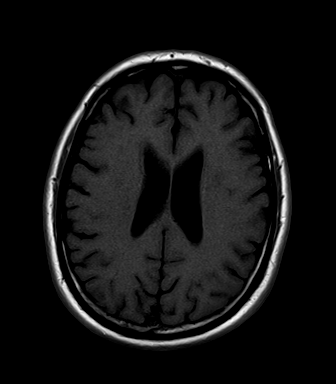
[im 26/26]
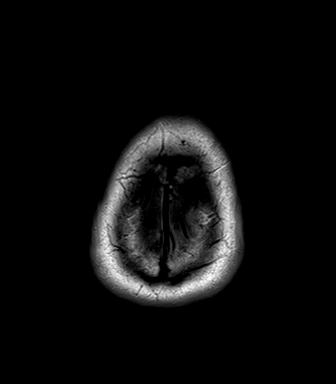

[Series 6: FLAIR · axial · 5.0mm · 0.47mm/px · z∈[-14,+117]mm · 4 of 26 slices shown]
[im 1/26]
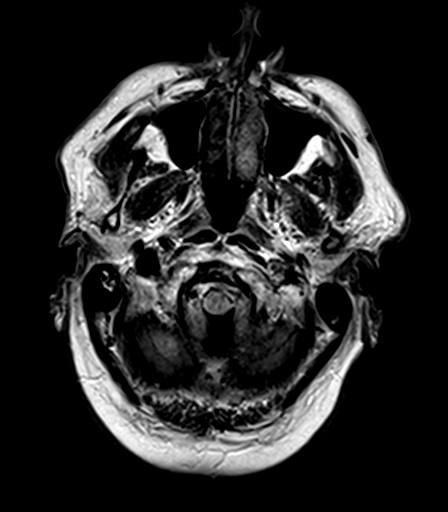
[im 9/26]
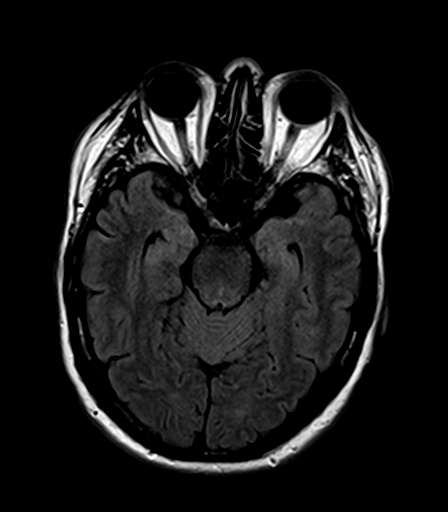
[im 17/26]
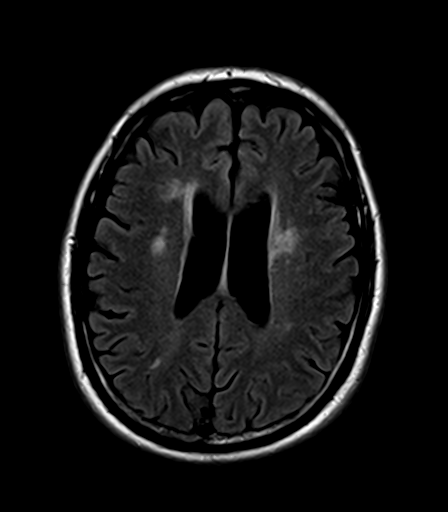
[im 26/26]
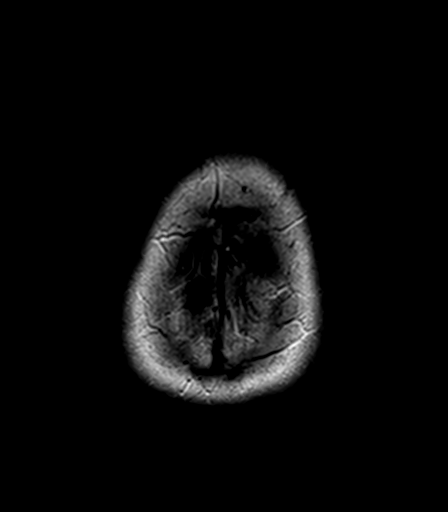

[Series 7: GRE · axial · 5.0mm · 0.47mm/px · z∈[-14,+117]mm · 4 of 26 slices shown]
[im 1/26]
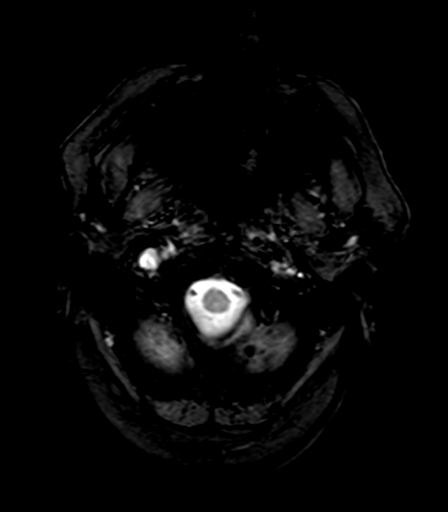
[im 9/26]
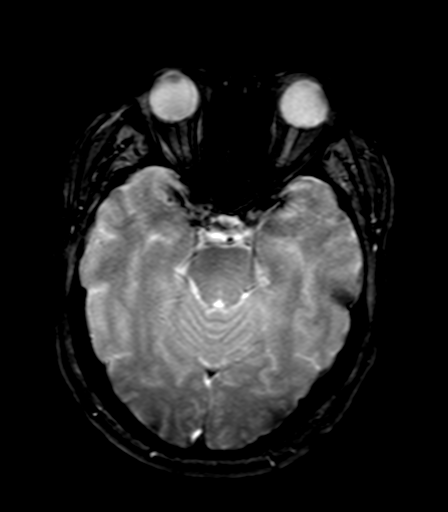
[im 17/26]
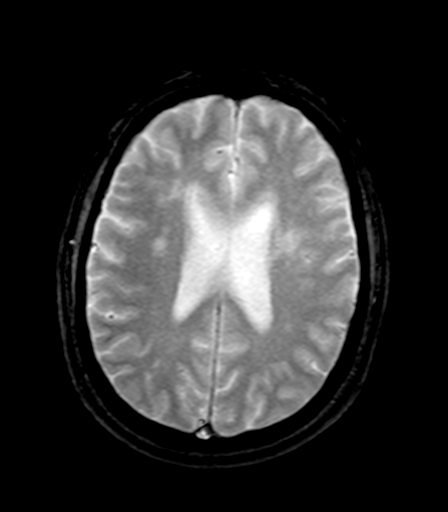
[im 26/26]
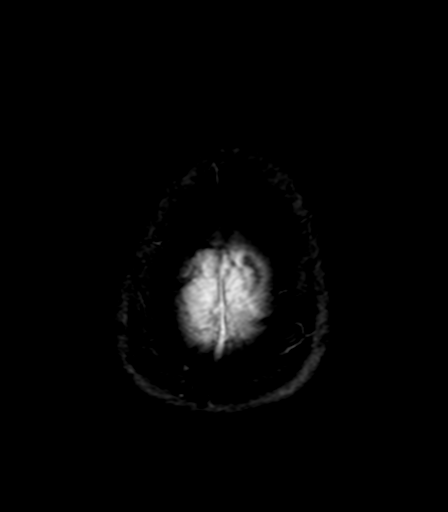

[Series 10: T1 · axial · 5.0mm · 0.47mm/px · 1 of 26 slices shown (3 of 3)]
[im 1/26]
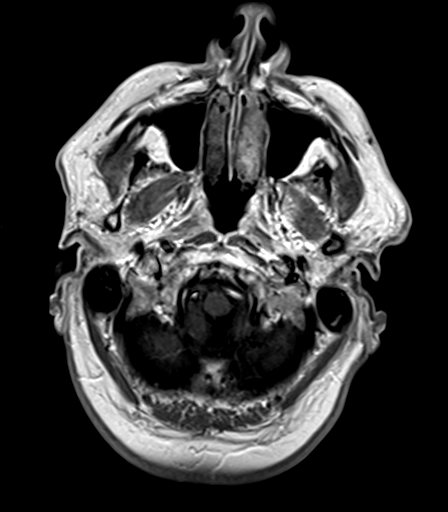

[28 of 48 positions shown; findings below may reference images not displayed]

FINDINGS: There continue to be several periventricular white matter lesions unchanged when compared to a prior scan.  There is very slight enhancement in the left posterior frontal white matter as compared to the past when there were several prominent enhancing lesions.  No new lesions are seen.  There is one area in the right superior posterior frontal lobe which is still present but almost not visualizable.
IMPRESSION: There has been definite gradual improvement over the years on the scan of Johanne Willette.  This current scan shows one very vague small area of enhancement in the right posterior frontal and perhaps a second one medially in the left posterior frontal.  Overall, the scans are definitely improving.

## 2023-11-25 ENCOUNTER — Other Ambulatory Visit (HOSPITAL_COMMUNITY): Payer: Self-pay | Admitting: Adult Reconstructive Orthopaedic Surgery

## 2023-11-25 ENCOUNTER — Other Ambulatory Visit: Payer: Self-pay

## 2023-11-25 ENCOUNTER — Ambulatory Visit
Admission: RE | Admit: 2023-11-25 | Discharge: 2023-11-25 | Disposition: A | Discharge status: Home / Self Care | Payer: Medicare Other | Source: Ambulatory Visit | Attending: Adult Reconstructive Orthopaedic Surgery | Admitting: Adult Reconstructive Orthopaedic Surgery

## 2023-11-25 DIAGNOSIS — R52 Pain, unspecified: Secondary | ICD-10-CM

## 2024-04-11 ENCOUNTER — Other Ambulatory Visit: Payer: Self-pay

## 2024-04-11 ENCOUNTER — Other Ambulatory Visit (HOSPITAL_COMMUNITY): Payer: Self-pay | Admitting: Adult Reconstructive Orthopaedic Surgery

## 2024-04-11 ENCOUNTER — Ambulatory Visit
Admission: RE | Admit: 2024-04-11 | Discharge: 2024-04-11 | Disposition: A | Discharge status: Home / Self Care | Source: Ambulatory Visit | Attending: Adult Reconstructive Orthopaedic Surgery | Admitting: Adult Reconstructive Orthopaedic Surgery

## 2024-04-11 DIAGNOSIS — R52 Pain, unspecified: Secondary | ICD-10-CM | POA: Insufficient documentation

## 2024-07-25 ENCOUNTER — Other Ambulatory Visit (HOSPITAL_COMMUNITY): Payer: Self-pay

## 2024-07-25 ENCOUNTER — Ambulatory Visit (HOSPITAL_BASED_OUTPATIENT_CLINIC_OR_DEPARTMENT_OTHER): Admission: RE | Admit: 2024-07-25 | Discharge: 2024-07-25 | Disposition: A | Source: Ambulatory Visit

## 2024-07-25 ENCOUNTER — Other Ambulatory Visit: Payer: Self-pay

## 2024-07-25 ENCOUNTER — Ambulatory Visit (HOSPITAL_BASED_OUTPATIENT_CLINIC_OR_DEPARTMENT_OTHER)

## 2024-07-25 ENCOUNTER — Ambulatory Visit: Admission: RE | Admit: 2024-07-25 | Discharge: 2024-07-25 | Disposition: A | Source: Ambulatory Visit

## 2024-07-25 DIAGNOSIS — Z01818 Encounter for other preprocedural examination: Secondary | ICD-10-CM

## 2024-07-25 DIAGNOSIS — M1612 Unilateral primary osteoarthritis, left hip: Secondary | ICD-10-CM

## 2024-07-25 LAB — CBC WITH DIFF
BASOPHIL #: <0.1 x10ˆ3/uL (ref <=0.20)
BASOPHIL %: 0.9 %
EOSINOPHIL #: 0.14 x10ˆ3/uL (ref <=0.50)
EOSINOPHIL %: 2.2 %
HCT: 34.6 % — ABNORMAL LOW (ref 38.9–52.0)
HGB: 11.1 g/dL — ABNORMAL LOW (ref 13.4–17.5)
IMMATURE GRANULOCYTE #: <0.1 x10ˆ3/uL (ref <0.10)
IMMATURE GRANULOCYTE %: 0.5 % (ref 0.0–1.0)
LYMPHOCYTE #: 1.26 x10ˆ3/uL (ref 1.00–4.80)
LYMPHOCYTE %: 19.4 %
MCH: 30.2 pg (ref 26.0–32.0)
MCHC: 32.1 g/dL (ref 31.0–35.5)
MCV: 94.3 fL (ref 78.0–100.0)
MONOCYTE #: 0.75 x10ˆ3/uL (ref 0.20–1.10)
MONOCYTE %: 11.6 %
NEUTROPHIL #: 4.25 x10ˆ3/uL (ref 1.50–7.70)
NEUTROPHIL %: 65.4 %
PLATELETS: 175 x10ˆ3/uL (ref 150–400)
RBC: 3.67 x10ˆ6/uL — ABNORMAL LOW (ref 4.50–6.10)
RDW-CV: 13.4 % (ref 11.5–15.5)
WBC: 6.5 x10ˆ3/uL (ref 3.7–11.0)

## 2024-07-25 LAB — URINALYSIS, MACRO/MICRO
BILIRUBIN: NEGATIVE mg/dL
BLOOD: NEGATIVE mg/dL
GLUCOSE: NEGATIVE mg/dL
KETONES: NEGATIVE mg/dL
LEUKOCYTES: NEGATIVE WBCs/uL
NITRITE: NEGATIVE
PH: 5.5 (ref 5.0–8.0)
PROTEIN: NEGATIVE mg/dL
SPECIFIC GRAVITY: 1.013 (ref 1.005–1.030)
UROBILINOGEN: NEGATIVE mg/dL

## 2024-07-25 LAB — BASIC METABOLIC PANEL
ANION GAP: 13 mmol/L (ref 4–13)
BUN/CREA RATIO: 25 — ABNORMAL HIGH (ref 6–22)
BUN: 21 mg/dL (ref 8–25)
CALCIUM: 9.2 mg/dL (ref 8.6–10.3)
CHLORIDE: 105 mmol/L (ref 96–111)
CO2 TOTAL: 19 mmol/L — ABNORMAL LOW (ref 23–31)
CREATININE: 0.83 mg/dL (ref 0.75–1.35)
GLUCOSE: 92 mg/dL (ref 65–125)
POTASSIUM: 5.1 mmol/L (ref 3.5–5.1)
SODIUM: 137 mmol/L (ref 136–145)
eGFRcr - MALE: >90 mL/min/1.73mˆ2 (ref >=60)

## 2024-07-25 LAB — MRSA/MSSA COLONIZATION SCREEN, PCR
MRSA COLONIZATION SCREEN: NEGATIVE
STAPHYLOCOCCUS AUREUS: POSITIVE — AB

## 2024-07-26 DIAGNOSIS — Z01818 Encounter for other preprocedural examination: Secondary | ICD-10-CM

## 2024-07-26 LAB — URINE CULTURE,ROUTINE: URINE CULTURE: 3000 — AB

## 2024-07-26 LAB — ECG 12 LEAD
Atrial Rate: 60 {beats}/min
Calculated P Axis: 58 degrees
Calculated R Axis: 58 degrees
Calculated T Axis: 44 degrees
PR Interval: 144 ms
QRS Duration: 88 ms
QT Interval: 430 ms
QTC Calculation: 430 ms
Ventricular rate: 60 {beats}/min

## 2024-07-27 DIAGNOSIS — Z01818 Encounter for other preprocedural examination: Secondary | ICD-10-CM

## 2024-08-18 ENCOUNTER — Other Ambulatory Visit (HOSPITAL_COMMUNITY): Payer: Self-pay

## 2024-08-18 DIAGNOSIS — R609 Edema, unspecified: Secondary | ICD-10-CM

## 2024-10-10 ENCOUNTER — Other Ambulatory Visit (HOSPITAL_COMMUNITY): Payer: Self-pay | Admitting: Nurse Practitioner

## 2024-10-10 DIAGNOSIS — M79604 Pain in right leg: Secondary | ICD-10-CM

## 2024-10-11 ENCOUNTER — Other Ambulatory Visit: Payer: Self-pay

## 2024-10-11 ENCOUNTER — Ambulatory Visit
Admission: RE | Admit: 2024-10-11 | Discharge: 2024-10-11 | Disposition: A | Discharge status: Home / Self Care | Source: Ambulatory Visit | Attending: Nurse Practitioner | Admitting: Nurse Practitioner

## 2024-10-11 DIAGNOSIS — M79604 Pain in right leg: Secondary | ICD-10-CM | POA: Insufficient documentation

## 2024-10-14 ENCOUNTER — Ambulatory Visit (HOSPITAL_BASED_OUTPATIENT_CLINIC_OR_DEPARTMENT_OTHER): Payer: Self-pay

## 2024-10-27 ENCOUNTER — Ambulatory Visit

## 2024-10-27 ENCOUNTER — Other Ambulatory Visit: Payer: Self-pay

## 2024-10-27 DIAGNOSIS — Z96641 Presence of right artificial hip joint: Secondary | ICD-10-CM | POA: Insufficient documentation

## 2024-10-27 LAB — URINALYSIS, MACRO/MICRO
BILIRUBIN: NEGATIVE mg/dL
BLOOD: NEGATIVE mg/dL
GLUCOSE: NEGATIVE mg/dL
KETONES: NEGATIVE mg/dL
NITRITE: NEGATIVE
PH: 5.5 (ref 5.0–8.0)
PROTEIN: NEGATIVE mg/dL
SPECIFIC GRAVITY: 1.02 (ref 1.005–1.030)
UROBILINOGEN: NEGATIVE mg/dL

## 2024-10-27 LAB — CBC WITH DIFF
BASOPHIL #: 0.12 x10ˆ3/uL (ref <=0.20)
BASOPHIL %: 1.4 %
EOSINOPHIL #: 0.11 x10ˆ3/uL (ref <=0.50)
EOSINOPHIL %: 1.3 %
HCT: 33.2 % — ABNORMAL LOW (ref 38.9–52.0)
HGB: 10.6 g/dL — ABNORMAL LOW (ref 13.4–17.5)
IMMATURE GRANULOCYTE #: <0.1 x10ˆ3/uL (ref <0.10)
IMMATURE GRANULOCYTE %: 0.5 % (ref 0.0–1.0)
LYMPHOCYTE #: 1.55 x10ˆ3/uL (ref 1.00–4.80)
LYMPHOCYTE %: 18.7 %
MCH: 28.8 pg (ref 26.0–32.0)
MCHC: 31.9 g/dL (ref 31.0–35.5)
MCV: 90.2 fL (ref 78.0–100.0)
MONOCYTE #: 0.88 x10ˆ3/uL (ref 0.20–1.10)
MONOCYTE %: 10.6 %
MPV: 11.5 fL (ref 8.7–12.5)
NEUTROPHIL #: 5.58 x10ˆ3/uL (ref 1.50–7.70)
NEUTROPHIL %: 67.5 %
PLATELETS: 296 x10ˆ3/uL (ref 150–400)
RBC: 3.68 x10ˆ6/uL — ABNORMAL LOW (ref 4.50–6.10)
RDW-CV: 16 % — ABNORMAL HIGH (ref 11.5–15.5)
WBC: 8.3 x10ˆ3/uL (ref 3.7–11.0)

## 2024-10-27 LAB — BASIC METABOLIC PANEL
ANION GAP: 11 mmol/L (ref 4–13)
BUN/CREA RATIO: 22 (ref 6–22)
BUN: 18 mg/dL (ref 8–25)
CALCIUM: 8.9 mg/dL (ref 8.6–10.3)
CHLORIDE: 103 mmol/L (ref 96–111)
CO2 TOTAL: 26 mmol/L (ref 23–31)
CREATININE: 0.81 mg/dL (ref 0.75–1.35)
GLUCOSE: 94 mg/dL (ref 65–125)
POTASSIUM: 4.7 mmol/L (ref 3.5–5.1)
SODIUM: 140 mmol/L (ref 136–145)
eGFRcr - MALE: >90 mL/min/1.73mˆ2 (ref >=60)

## 2024-10-27 LAB — MRSA COLONIZATION SCREEN, PCR: MRSA COLONIZATION SCREEN: NEGATIVE
